# Patient Record
Sex: Male | Born: 2000 | Race: White | Marital: Single | Smoking: Never smoker
Health system: Southern US, Community
[De-identification: ages and names within clinical notes are randomized; demographics above are authoritative.]

## PROBLEM LIST (undated history)

## (undated) DIAGNOSIS — N433 Hydrocele, unspecified: Secondary | ICD-10-CM

## (undated) DIAGNOSIS — Z973 Presence of spectacles and contact lenses: Secondary | ICD-10-CM

## (undated) DIAGNOSIS — Z9229 Personal history of other drug therapy: Secondary | ICD-10-CM

## (undated) DIAGNOSIS — F909 Attention-deficit hyperactivity disorder, unspecified type: Secondary | ICD-10-CM

## (undated) HISTORY — PX: CYST REMOVAL LEG: SHX6280

## (undated) HISTORY — PX: SKIN GRAFT: SHX250

## (undated) HISTORY — PX: TYMPANOSTOMY TUBE PLACEMENT: SHX32

---
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
## 2019-01-04 IMAGING — DX DG ANKLE COMPLETE 3+V*L*
3 series · 3 of 3 positions shown · non-contrast
Comparison: None.

CLINICAL DATA: Bleeding from surgical incisions type status post
osteotomy on 08/02/2017

EXAM:
LEFT ANKLE COMPLETE - 3+ VIEW

[ankle ap]
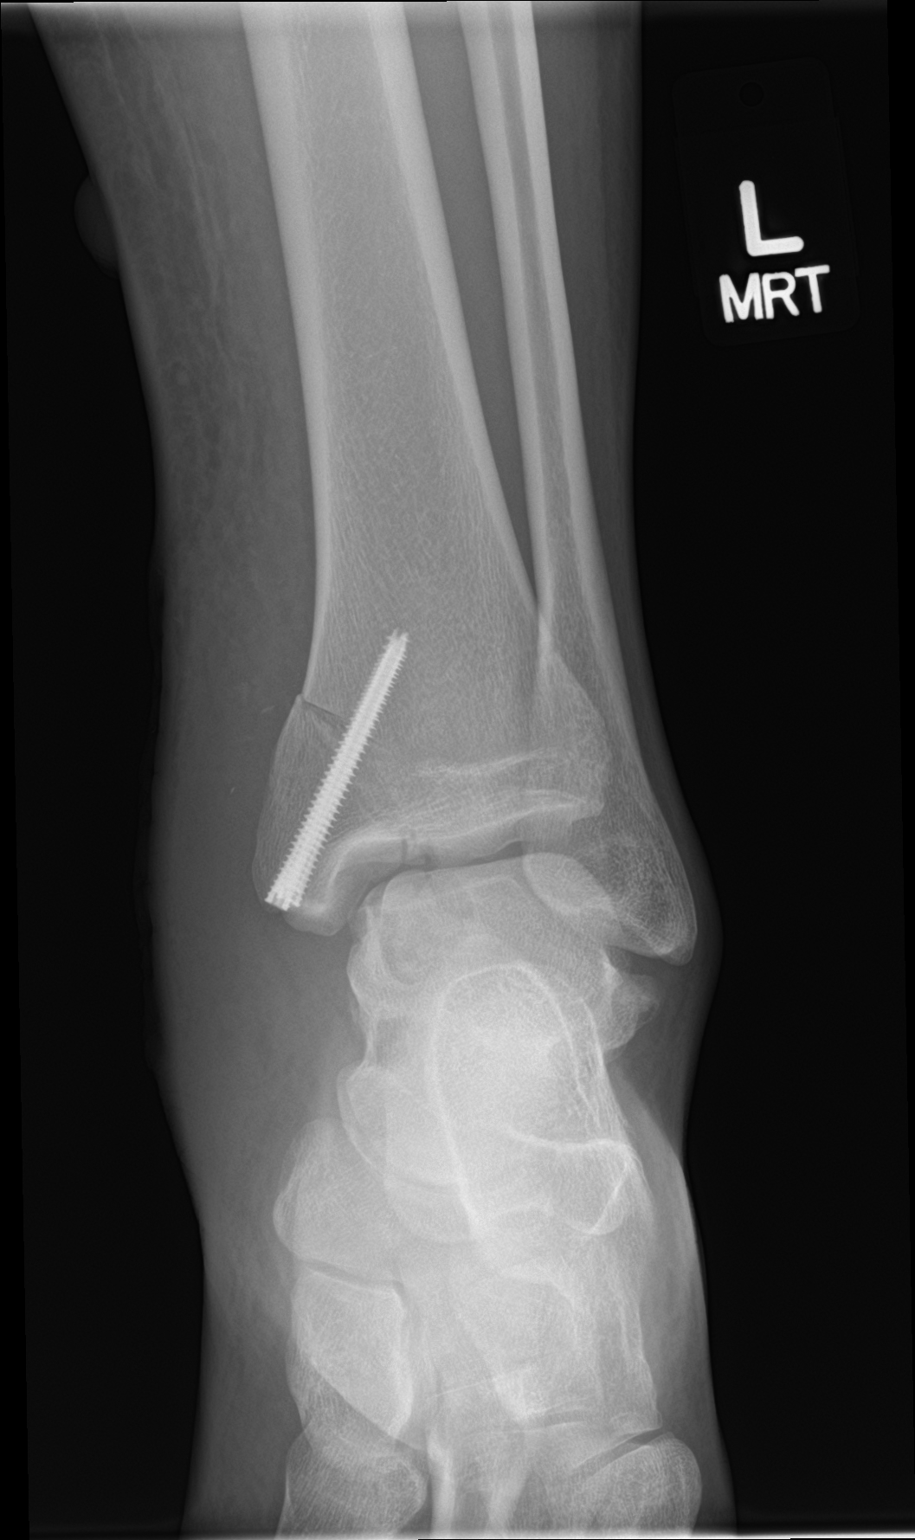

[ankle obl]
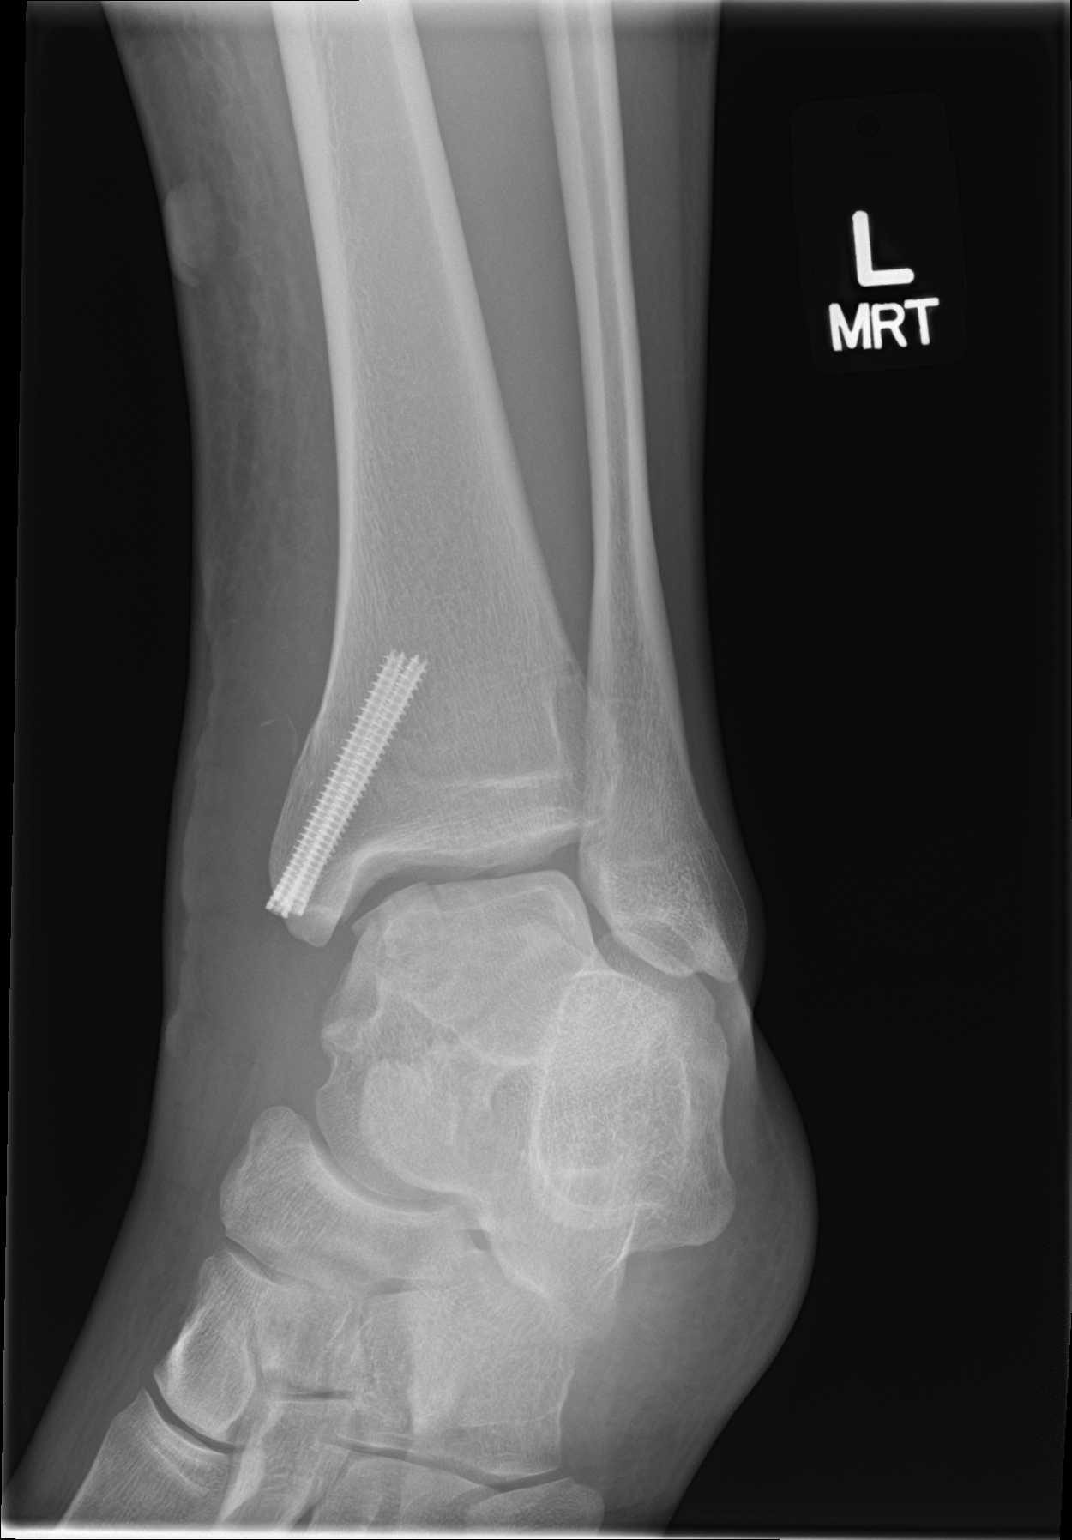

[ankle lat]
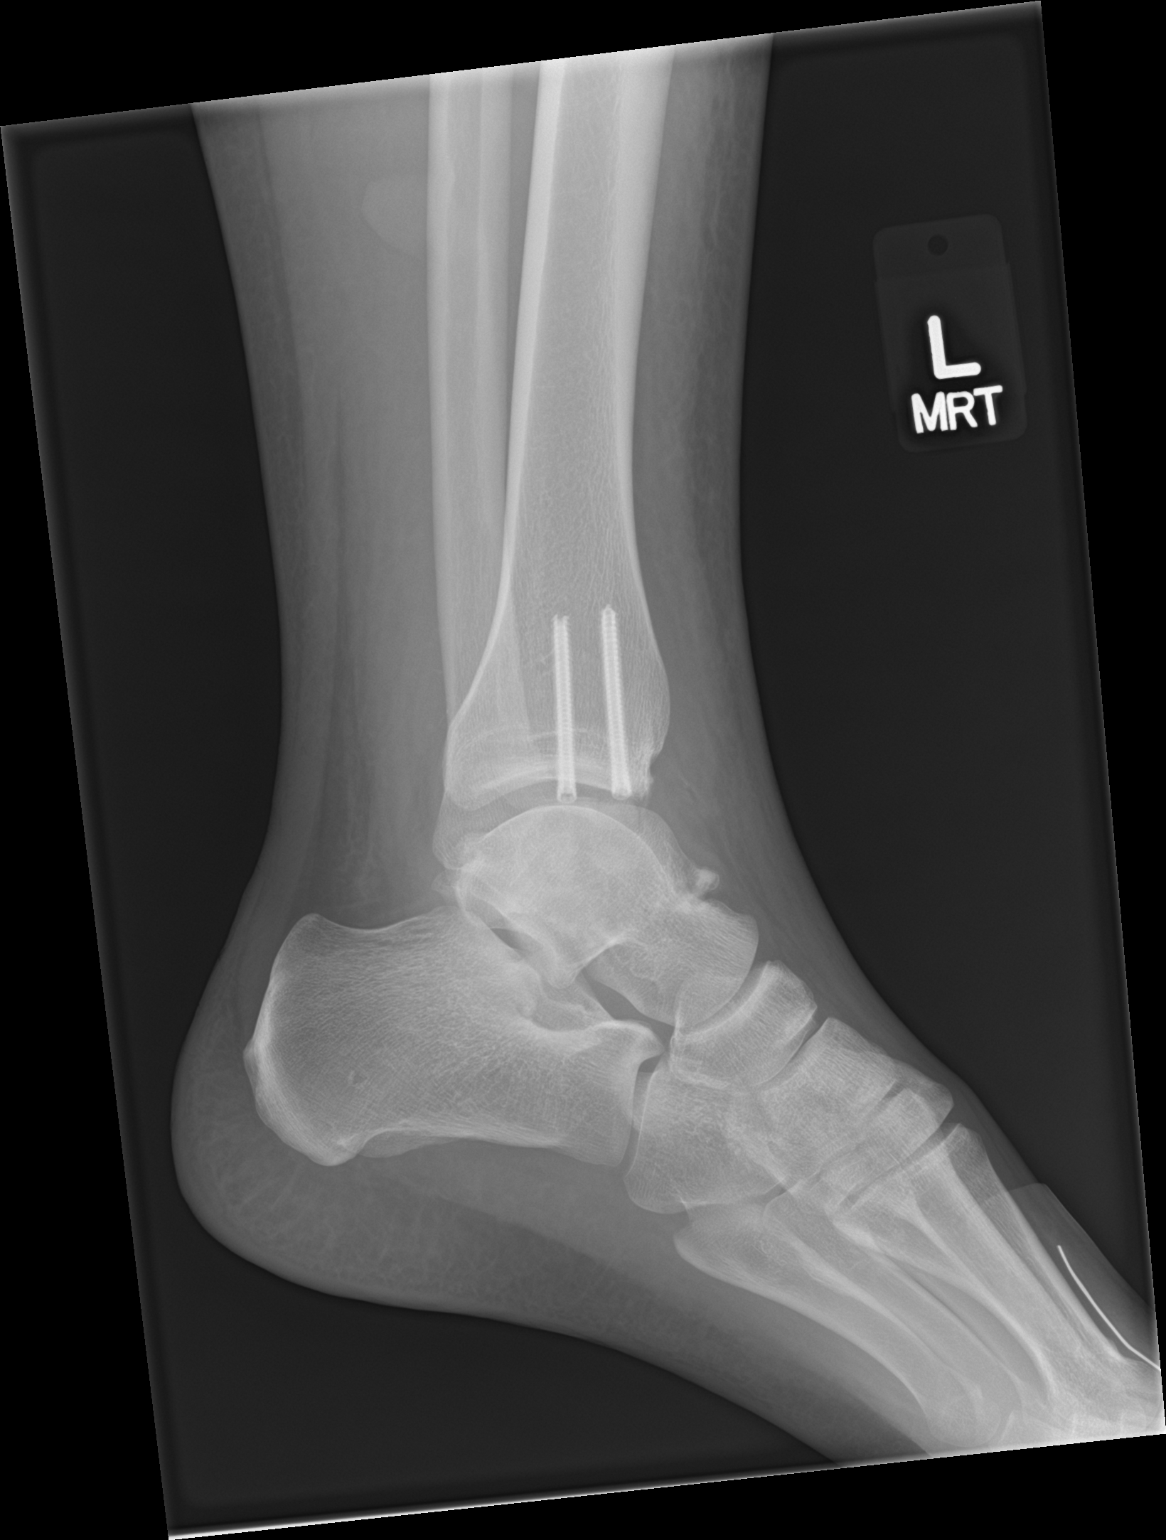

[3 of 3 positions shown; findings below may reference images not displayed]

FINDINGS: There is moderate soft tissue swelling over the medial malleolus
where there has been an osteotomy performed involving the medial
malleolus. Two fixation screws are seen traversing the osteotomy
site without hardware complications. Large osteochondral defect of
the medial talar dome is noted with allograft material seen within.
Superficial skin lesion along the medial aspect of the distal left
tibia is identified measuring up to 1.5 cm in length. No frank bone
destruction to suggest osteomyelitis. The subtalar and midfoot
articulations are maintained. On the lateral view there is a
metallic wire device projecting over the dorsum of the forefoot
possibly external to the patient.
IMPRESSION: 1. Allograft material in medial talar dome osteochondral defect with
osteotomy changes involving the medial malleolus. No hardware
failure nor complicating features.
2. No radiographic evidence of acute osteomyelitis.
3. Moderate degree of soft tissue swelling and edema is noted over
the medial malleolus. No soft tissue emphysema is seen.

## 2020-07-05 ENCOUNTER — Other Ambulatory Visit: Payer: Self-pay

## 2020-07-05 ENCOUNTER — Emergency Department (HOSPITAL_COMMUNITY)
Admission: EM | Admit: 2020-07-05 | Discharge: 2020-07-06 | Disposition: A | Discharge status: Home / Self Care | Payer: Medicaid Other | Attending: Medical | Admitting: Medical

## 2020-07-05 ENCOUNTER — Encounter (HOSPITAL_COMMUNITY): Payer: Self-pay | Admitting: Emergency Medicine

## 2020-07-05 DIAGNOSIS — N5089 Other specified disorders of the male genital organs: Secondary | ICD-10-CM | POA: Insufficient documentation

## 2020-07-05 DIAGNOSIS — R369 Urethral discharge, unspecified: Secondary | ICD-10-CM | POA: Insufficient documentation

## 2020-07-05 DIAGNOSIS — Z113 Encounter for screening for infections with a predominantly sexual mode of transmission: Secondary | ICD-10-CM | POA: Insufficient documentation

## 2020-07-05 LAB — URINALYSIS, ROUTINE W REFLEX MICROSCOPIC
Bacteria, UA: NONE SEEN
Bilirubin Urine: NEGATIVE
Glucose, UA: NEGATIVE mg/dL
Hgb urine dipstick: NEGATIVE
Ketones, ur: NEGATIVE mg/dL
Nitrite: NEGATIVE
Protein, ur: NEGATIVE mg/dL
Specific Gravity, Urine: 1.015 (ref 1.005–1.030)
pH: 7 (ref 5.0–8.0)

## 2020-07-05 MED ORDER — CEFTRIAXONE SODIUM 500 MG IJ SOLR
500.0000 mg | Freq: Once | INTRAMUSCULAR | Status: AC
  Administered 2020-07-06: 500 mg via INTRAMUSCULAR
  Filled 2020-07-05: qty 500

## 2020-07-05 MED ORDER — DOXYCYCLINE HYCLATE 100 MG PO CAPS: 100.0000 mg | ORAL_CAPSULE | Freq: Two times a day (BID) | ORAL | 0 refills | Status: AC

## 2020-07-05 MED ORDER — DOXYCYCLINE HYCLATE 100 MG PO TABS
100.0000 mg | ORAL_TABLET | Freq: Once | ORAL | Status: AC
  Administered 2020-07-06: 100 mg via ORAL
  Filled 2020-07-05: qty 1

## 2020-07-05 MED ORDER — STERILE WATER FOR INJECTION IJ SOLN
INTRAMUSCULAR | Status: AC
  Administered 2020-07-06: 10 mL
  Filled 2020-07-05: qty 10

## 2020-07-05 NOTE — ED Triage Notes (Signed)
Pt reports penile discharge x 1 week.  [place] but didn't want to wait for results.

## 2020-07-05 NOTE — ED Provider Notes (Signed)
Emergency Medicine Provider Triage Evaluation Note  KAYLAN FRIEDMANN , a 20 y.o. male  was evaluated in triage.  Pt complains of penile discharge and genital lesions that he noticed this morning. He is sexually active with 2 females partner; does not use protection. No testicular swelling/pain. No other complaints.   Review of Systems  Positive: + penile discharge, genital lesion Negative: - fevers, abdominal pain, testicular pain/swelling  Physical Exam  BP 130/81 (BP Location: Right Arm)   Pulse (!) 52   Temp 98.2 F (36.8 C) (Oral)   Resp 16   SpO2 100%  Gen:   Awake, no distress   Resp:  Normal effort  MSK:   Moves extremities without difficulty  Other:  GU exam deferred in triage  Medical Decision Making  Medically screening exam initiated at 4:07 PM.  Appropriate orders placed.  OLE LAFON was informed that the remainder of the evaluation will be completed by another provider, this initial triage assessment does not replace that evaluation, and the importance of remaining in the ED until their evaluation is complete.     Tanda Rockers, PA-C 07/05/20 1608    Koleen Distance, MD 07/05/20 [phone]

## 2020-07-05 NOTE — Discharge Instructions (Addendum)
1. Medications: Doxycycline, usual home medications 2. Treatment: rest, drink plenty of fluids, use a condom with every sexual encounter, monitor rash and return for worsening 3. Follow Up: Please followup with your primary doctor in 3 days for discussion of your diagnoses and further evaluation after today's visit; if you do not have a primary care doctor use the resource guide provided to find one; Please return to the ER for worsening symptoms, high fevers or persistent vomiting.  You have been tested for chlamydia and gonorrhea.  These results will be available in approximately 3 days.  Please inform all sexual partners if you test positive for any of these diseases.
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
